# Patient Record
Sex: Male | Born: 1995 | Hispanic: No | Marital: Single
Health system: Southern US, Community
[De-identification: ages and names within clinical notes are randomized; demographics above are authoritative.]

---
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
## 2019-12-09 ENCOUNTER — Ambulatory Visit
Admission: RE | Admit: 2019-12-09 | Discharge: 2019-12-09 | Disposition: A | Discharge status: Home / Self Care | Payer: 59 | Source: Ambulatory Visit | Attending: Family Medicine | Admitting: Family Medicine

## 2019-12-09 ENCOUNTER — Other Ambulatory Visit: Payer: Self-pay | Admitting: Student

## 2019-12-09 ENCOUNTER — Other Ambulatory Visit: Payer: Self-pay | Admitting: Radiology

## 2019-12-09 ENCOUNTER — Other Ambulatory Visit: Payer: Self-pay

## 2019-12-09 DIAGNOSIS — N50812 Left testicular pain: Secondary | ICD-10-CM

## 2019-12-09 DIAGNOSIS — N50811 Right testicular pain: Secondary | ICD-10-CM

## 2020-11-23 IMAGING — US US SCROTUM W/ DOPPLER COMPLETE
1 series · 14 of 25 positions shown · non-contrast
Comparison: None.

CLINICAL DATA: Testicular pain

EXAM:
SCROTAL ULTRASOUND
DOPPLER ULTRASOUND OF THE TESTICLES
TECHNIQUE: Complete ultrasound examination of the testicles, epididymis, and
other scrotal structures was performed. Color and spectral Doppler
ultrasound were also utilized to evaluate blood flow to the
testicles.

[Series 1: us scrotum w/ doppler complete · 0.07mm/px · 14 of 66 slices shown]
[im 1/66]
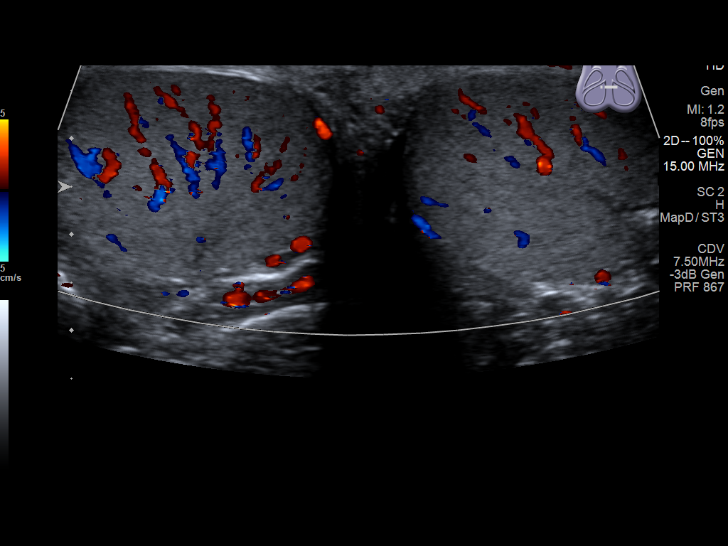
[im 6/66]
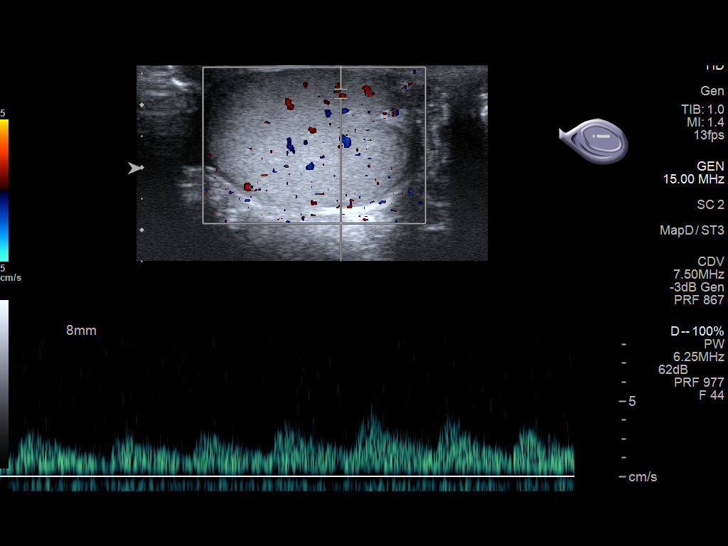
[im 11/66]
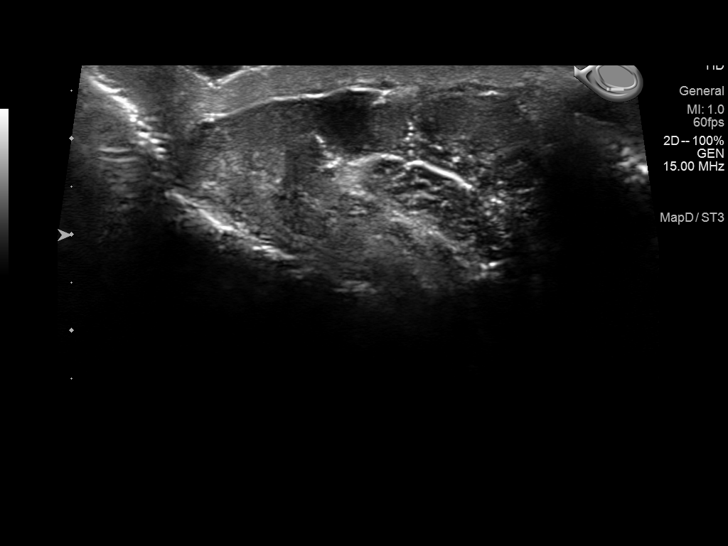
[im 17/66]
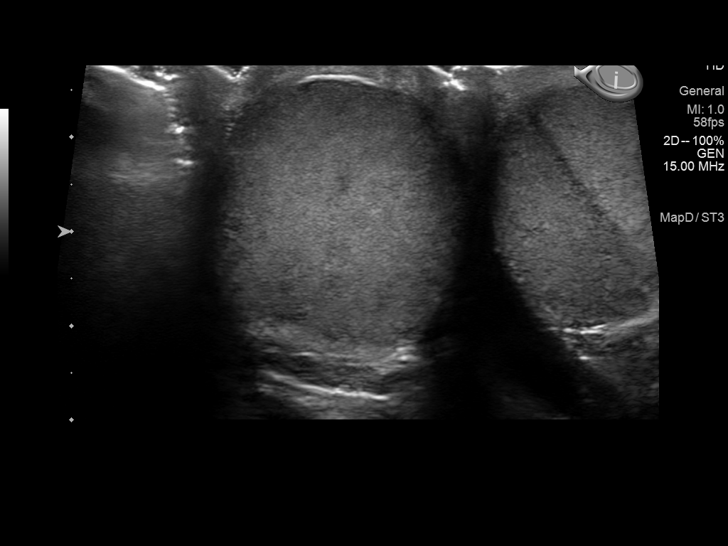
[im 22/66]
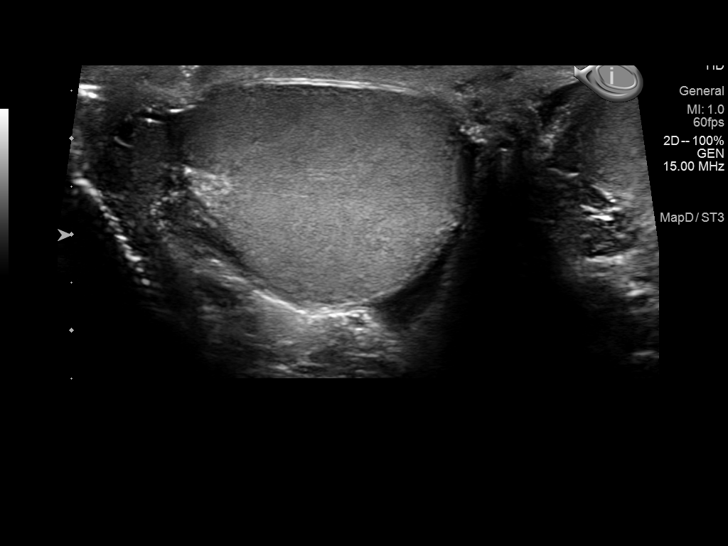
[im 25/66]
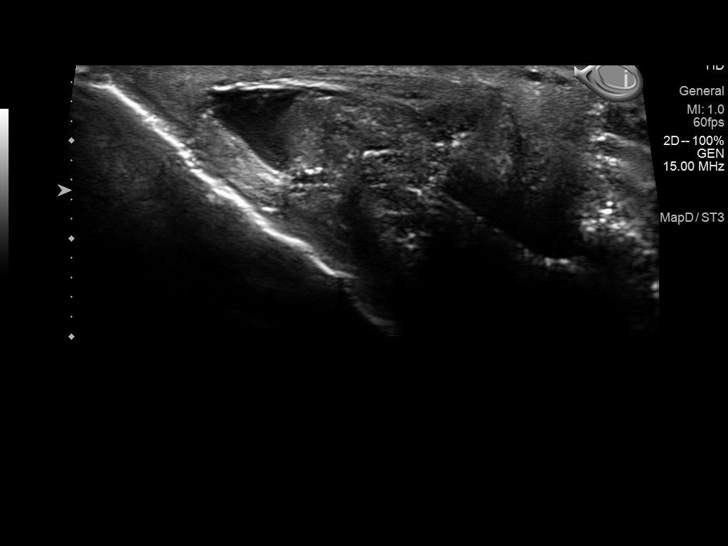
[im 30/66]
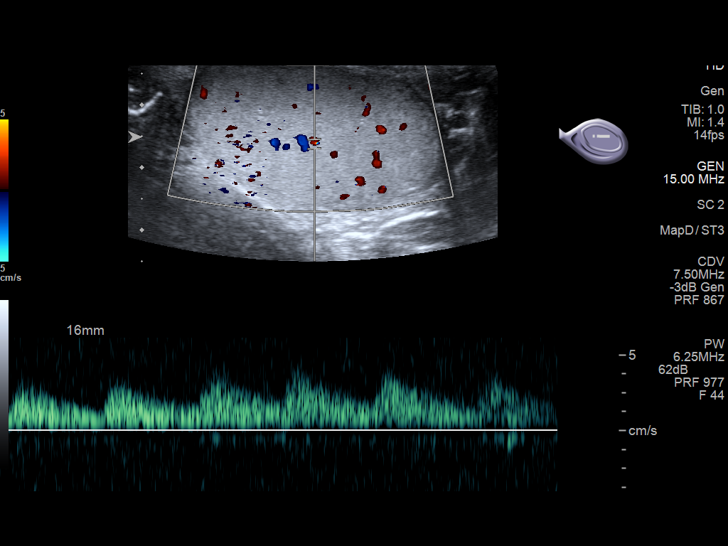
[im 36/66]
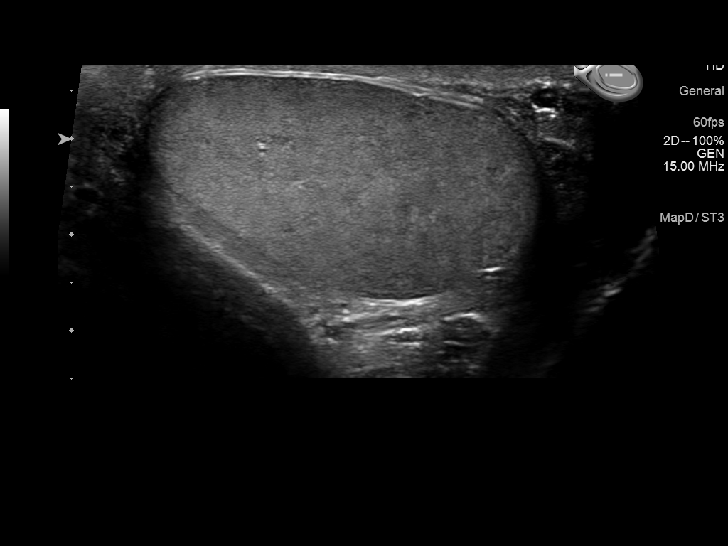
[im 41/66]
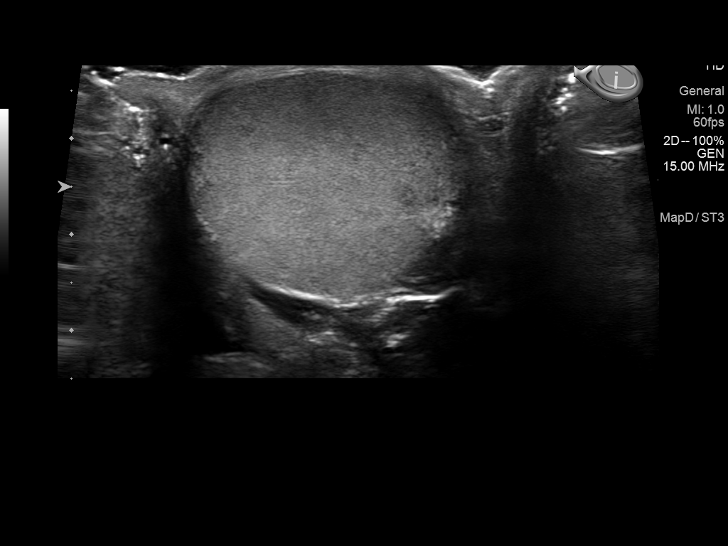
[im 44/66]
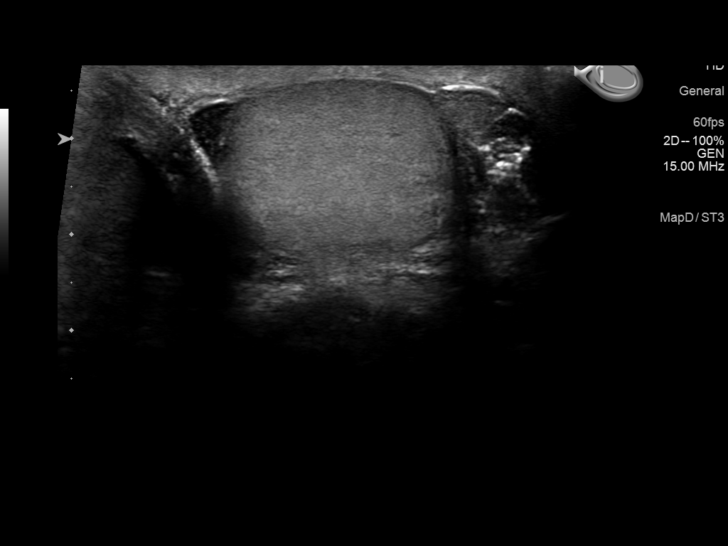
[im 49/66]
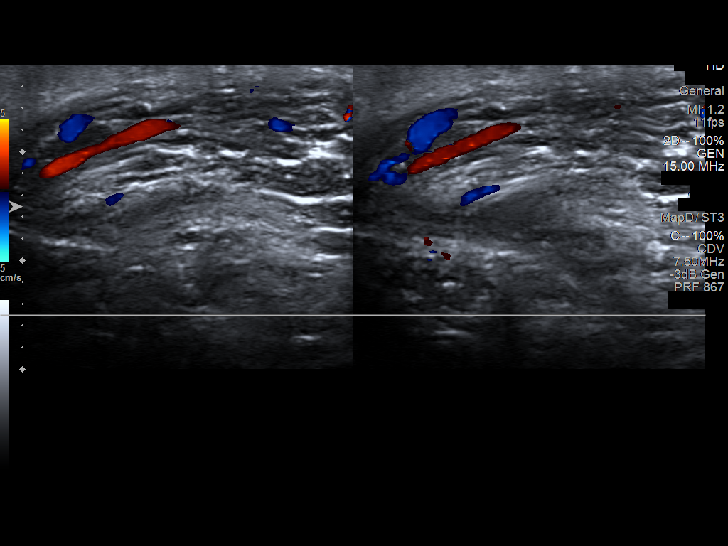
[im 55/66]
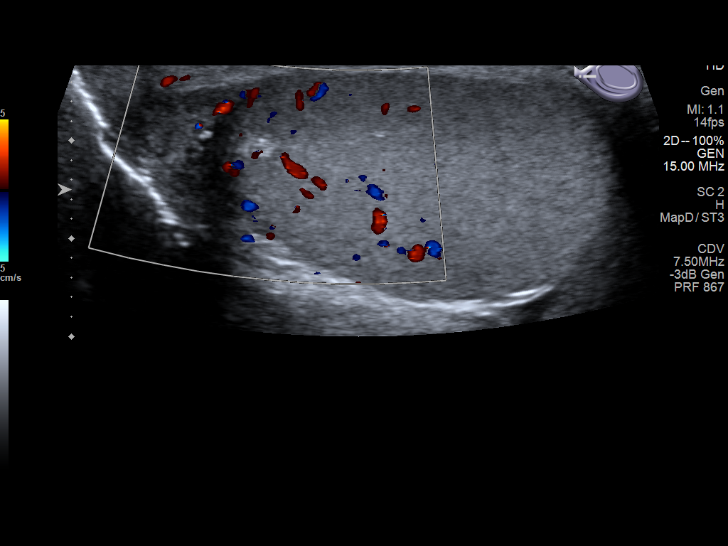
[im 60/66]
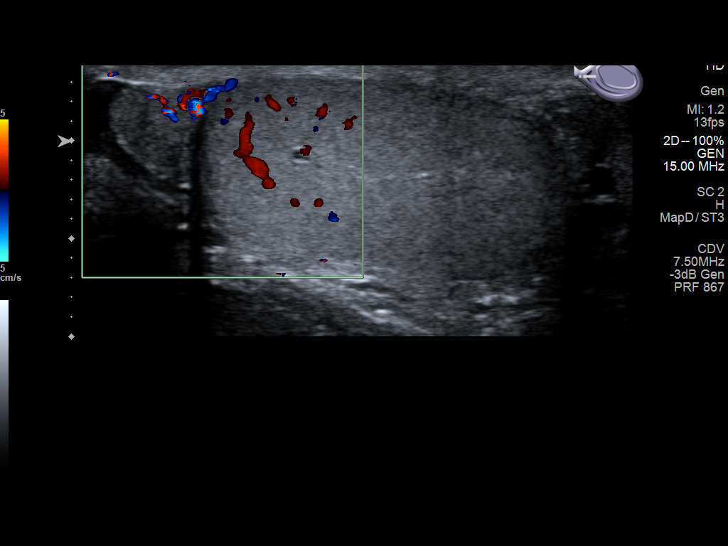
[im 66/66]
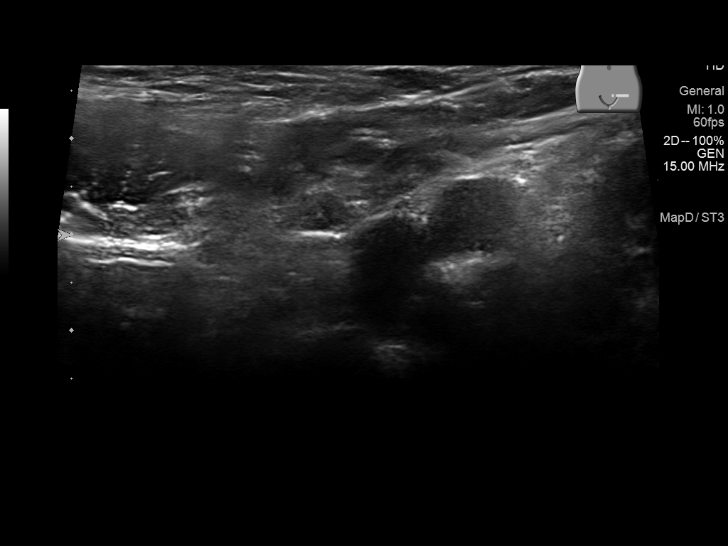

[14 of 25 positions shown; findings below may reference images not displayed]

FINDINGS: Right testicle

Measurements: 4.5 x 2.4 x 2.7 cm. No mass or microlithiasis
visualized.

Left testicle

Measurements: 4.4 x 2.2 x 2.9 cm. No mass or microlithiasis
visualized.

Right epididymis:  Normal in size and appearance.

Left epididymis:  Normal in size and appearance.

Hydrocele:  Small bilateral hydroceles.

Varicocele:  Small left varicocele.

Pulsed Doppler interrogation of both testes demonstrates normal low
resistance arterial and venous waveforms bilaterally.
IMPRESSION: Normal-appearing testicles bilaterally.

Small bilateral hydroceles.

Small left varicocele.
